# Patient Record
Sex: Male | Born: 1975 | Race: Black or African American | Hispanic: No
Health system: Southern US, Community
[De-identification: ages and names within clinical notes are randomized; demographics above are authoritative.]

## PROBLEM LIST (undated history)

## (undated) HISTORY — PX: OTHER SURGICAL HISTORY: SHX169

---

## 2003-01-03 ENCOUNTER — Encounter: Admission: RE | Admit: 2003-01-03 | Discharge: 2003-01-03 | Payer: Self-pay | Admitting: Occupational Medicine

## 2005-01-14 ENCOUNTER — Emergency Department (HOSPITAL_COMMUNITY): Admission: EM | Admit: 2005-01-14 | Discharge: 2005-01-14 | Payer: Self-pay | Admitting: Family Medicine

## 2011-04-08 ENCOUNTER — Encounter: Payer: Self-pay | Admitting: Gastroenterology

## 2011-04-08 ENCOUNTER — Ambulatory Visit: Payer: Self-pay | Admitting: Internal Medicine

## 2011-04-08 ENCOUNTER — Ambulatory Visit (INDEPENDENT_AMBULATORY_CARE_PROVIDER_SITE_OTHER): Payer: PRIVATE HEALTH INSURANCE | Admitting: Gastroenterology

## 2011-04-08 VITALS — BP 130/70 | HR 72 | Ht 72.0 in | Wt 230.0 lb

## 2011-04-08 DIAGNOSIS — Z809 Family history of malignant neoplasm, unspecified: Secondary | ICD-10-CM

## 2011-04-08 DIAGNOSIS — R1310 Dysphagia, unspecified: Secondary | ICD-10-CM | POA: Insufficient documentation

## 2011-04-08 DIAGNOSIS — K3189 Other diseases of stomach and duodenum: Secondary | ICD-10-CM

## 2011-04-08 DIAGNOSIS — R1013 Epigastric pain: Secondary | ICD-10-CM | POA: Insufficient documentation

## 2011-04-08 MED ORDER — PEG-KCL-NACL-NASULF-NA ASC-C 100 G PO SOLR: 1.0000 | ORAL | Status: DC

## 2011-04-08 NOTE — Patient Instructions (Signed)
You will be set up for a colonoscopy.  

## 2011-04-08 NOTE — Progress Notes (Signed)
HPI: This is a      very pleasant  36 year old man whom I am meeting for the first time today  2/13 labs: normal CBC, normal cmet  Student at [employer].  Has abd pain, flatulence.  H/o colon cancer in family, mother just passed from it last week.  Diagnosed in her late 65s.  Her brother also had colon cancer.    He has mild intermittent constipation.  Thinks he has IBS.  Tends to have BM every day.  NEver sees blood in his stool.  Overall stable weight.  Review of systems: Pertinent positive and negative review of systems were noted in the above HPI section. Complete review of systems was performed and was otherwise normal.    History reviewed. No pertinent past medical history.  Past Surgical History  Procedure Date  . Neg hx     No current outpatient prescriptions on file.    Allergies as of 04/08/2011  . (No Known Allergies)    Family History  Problem Relation Age of Onset  . Colon cancer Mother 71  . Diabetes Father     History   Social History  . Marital Status: Married    Spouse Name: N/A    Number of Children: 1  . Years of Education: N/A   Occupational History  . student     Social History Main Topics  . Smoking status: Former Smoker    Quit date: 04/07/2001  . Smokeless tobacco: Never Used  . Alcohol Use: Yes     social  . Drug Use: No  . Sexually Active: Not on file   Other Topics Concern  . Not on file   Social History Narrative   Caffeine soda qd       Physical Exam: BP 130/70  Pulse 72  Ht 6' (1.829 m)  Wt 230 lb (104.327 kg)  BMI 31.19 kg/m2 Constitutional: generally well-appearing Psychiatric: alert and oriented x3 Eyes: extraocular movements intact Mouth: oral pharynx moist, no lesions Neck: supple no lymphadenopathy Cardiovascular: heart regular rate and rhythm Lungs: clear to auscultation bilaterally Abdomen: soft,  very mild left-sided abdominal pain, lower quadrant, nondistended, no obvious ascites, no peritoneal signs,  normal bowel sounds Extremities: no lower extremity edema bilaterally Skin: no lesions on visible extremities    Assessment and plan: 36 y.o. male with  minor intermittent abdominal discomforts, family history of colon cancer  We will proceed with full colonoscopy at his soonest convenience. I suspect minor irritable bowel syndrome and hopefully some of his symptoms will lessen if we can prove he does not have significant colonic neoplasm like his mother.

## 2011-04-22 ENCOUNTER — Other Ambulatory Visit: Payer: PRIVATE HEALTH INSURANCE | Admitting: Gastroenterology

## 2014-11-25 ENCOUNTER — Emergency Department (HOSPITAL_COMMUNITY)
Admission: EM | Admit: 2014-11-25 | Discharge: 2014-11-25 | Disposition: A | Discharge status: Home / Self Care | Payer: Non-veteran care | Attending: Emergency Medicine | Admitting: Emergency Medicine

## 2014-11-25 ENCOUNTER — Emergency Department (HOSPITAL_COMMUNITY): Payer: Non-veteran care

## 2014-11-25 ENCOUNTER — Emergency Department (HOSPITAL_COMMUNITY)
Admission: EM | Admit: 2014-11-25 | Discharge: 2014-11-25 | Discharge status: Absent without leave | Payer: PRIVATE HEALTH INSURANCE

## 2014-11-25 ENCOUNTER — Encounter (HOSPITAL_COMMUNITY): Payer: Self-pay | Admitting: Emergency Medicine

## 2014-11-25 DIAGNOSIS — Z87891 Personal history of nicotine dependence: Secondary | ICD-10-CM | POA: Insufficient documentation

## 2014-11-25 DIAGNOSIS — M25551 Pain in right hip: Secondary | ICD-10-CM | POA: Diagnosis present

## 2014-11-25 DIAGNOSIS — R269 Unspecified abnormalities of gait and mobility: Secondary | ICD-10-CM | POA: Insufficient documentation

## 2014-11-25 DIAGNOSIS — M79604 Pain in right leg: Secondary | ICD-10-CM | POA: Insufficient documentation

## 2014-11-25 MED ORDER — HYDROCODONE-ACETAMINOPHEN 5-325 MG PO TABS: 1.0000 | ORAL_TABLET | ORAL | Status: DC | PRN

## 2014-11-25 MED ORDER — IBUPROFEN 800 MG PO TABS: 800.0000 mg | ORAL_TABLET | Freq: Three times a day (TID) | ORAL | Status: DC

## 2014-11-25 MED ORDER — HYDROCODONE-ACETAMINOPHEN 5-325 MG PO TABS
2.0000 | ORAL_TABLET | Freq: Once | ORAL | Status: AC
  Administered 2014-11-25: 2 via ORAL
  Filled 2014-11-25: qty 2

## 2014-11-25 NOTE — ED Notes (Signed)
AVS explained in detail. Given follow up information. Ambulatory with steady gait. No other c/c.

## 2014-11-25 NOTE — ED Provider Notes (Signed)
CSN: [id number]     Arrival date & time 11/25/14  1914 History  By signing my name below, I, Tula Nakayama, attest that this documentation has been prepared under the direction and in the presence of Bernerd Limbo, [place].  Electronically Signed: Tula Nakayama, ED Scribe. 11/25/2014. 7:46 PM.   Chief Complaint  Patient presents with  . Hip Pain  . Leg Pain   The history is provided by the patient. No language interpreter was used.    HPI Comments: Erik Robbins is a 39 y.o. male with no pertinent PMH who presents to the Emergency Department complaining of constant, moderate right hip pain that radiates to his right thigh and right knee. He states his symptoms started 3 days ago. He reports movement exacerbates his pain. He has tried acetaminophen, ibuprofen, and heat for symptom relief, which has not been effective. He denies recent injury. He denies history of DVT, history of malignancy, tobacco use, recent immobility, recent surgery. He denies fever, chills, CP, SOB, abdominal pain, diarrhea, nausea, vomiting, dysuria, frequency, urgency, paresthesia, numbness, neck pain, back pain, testicular pain or swelling, penile pain or swelling.Marland Kitchen  No past medical history on file. Past Surgical History  Procedure Laterality Date  . Neg hx     Family History  Problem Relation Age of Onset  . Colon cancer Mother 37  . Diabetes Father   . Colon cancer Maternal Uncle    Social History  Substance Use Topics  . Smoking status: Former Smoker    Quit date: 04/07/2001  . Smokeless tobacco: Never Used  . Alcohol Use: Yes     Comment: social  Review of Systems  Constitutional: Negative for fever and chills.  Respiratory: Negative for shortness of breath.   Cardiovascular: Negative for chest pain and leg swelling.  Gastrointestinal: Negative for nausea, vomiting, abdominal pain and diarrhea.  Genitourinary: Negative for dysuria, frequency, discharge, penile swelling, scrotal swelling,  penile pain and testicular pain.  Musculoskeletal: Positive for myalgias, arthralgias and gait problem. Negative for back pain, joint swelling, neck pain and neck stiffness.       Reports difficulty ambulating due to pain.  Skin: Negative for color change, pallor, rash and wound.  Neurological: Negative for dizziness, weakness, light-headedness, numbness and headaches.  All other systems reviewed and are negative.  Allergies  Review of patient's allergies indicates no known allergies.  Home Medications   Prior to Admission medications   Medication Sig Start Date End Date Taking? Authorizing Provider  peg 3350 powder (MOVIPREP) 100 G SOLR Take 1 kit (100 g total) by mouth as directed. See written handout 04/08/11   Milus Banister, MD    BP 154/93 mmHg  Pulse 103  Temp(Src) 99.1 F (37.3 C) (Oral)  Resp 17  Ht '6\' 2"'  (1.88 m)  Wt 225 lb (102.059 kg)  BMI 28.88 kg/m2  SpO2 100% Physical Exam  Constitutional: He is oriented to person, place, and time. He appears well-developed and well-nourished. No distress.  HENT:  Head: Normocephalic and atraumatic.  Right Ear: External ear normal.  Left Ear: External ear normal.  Nose: Nose normal.  Mouth/Throat: Oropharynx is clear and moist.  Eyes: Conjunctivae and EOM are normal. Pupils are equal, round, and reactive to light.  Neck: Normal range of motion. Neck supple.  Cardiovascular: Normal rate, regular rhythm, normal heart sounds and intact distal pulses.   Pulmonary/Chest: Effort normal and breath sounds normal. No respiratory distress. He has no wheezes. He has no rales.  Abdominal: Soft.  Bowel sounds are normal. He exhibits no distension and no mass. There is no tenderness. There is no rebound and no guarding.  Musculoskeletal: Normal range of motion. He exhibits tenderness. He exhibits no edema.  Mild TTP of right lateral hip and right anterior thigh. Decreased ROM of right lower extremity at hip due to pain. No edema, erythema, or  heat. Strength and sensation intact. Distal pulses intact.  Neurological: He is alert and oriented to person, place, and time. He has normal strength. No sensory deficit.  Skin: Skin is warm and dry. No rash noted. No erythema. No pallor.  Psychiatric: He has a normal mood and affect. His behavior is normal. Judgment and thought content normal.  Nursing note and vitals reviewed.   ED Course  Procedures   DIAGNOSTIC STUDIES: Oxygen Saturation is 100% on RA, normal by my interpretation.    COORDINATION OF CARE: 7:41 PM Discussed treatment plan with pt which includes an x-ray of his right hip and pain management. Pt agreed to plan.  Imaging Review Dg Hip Unilat With Pelvis 2-3 Views Right  11/25/2014   CLINICAL DATA:  Right hip pain for 2 days  EXAM: DG HIP (WITH OR WITHOUT PELVIS) 2-3V RIGHT  COMPARISON:  None.  FINDINGS: Vague tiny bony densities are seen lateral to the superior lip of the right acetabulum. There is no associated lytic bone lesion or evidence of bony destruction. Otherwise, there is no evidence of fracture or dislocation. 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  IMPRESSION: There are vague soft tissue calcifications adjacent to the superior right acetabular lip. There is no associated bone lesion. These findings may represent sequelae of trauma or a calcified soft tissue mass. Consider MRI if clinically indicated.   Electronically Signed   By: Marybelle Killings M.D.   On: 11/25/2014 20:04     I have personally reviewed and evaluated these images as part of my medical decision-making.  MDM   Final diagnoses:  Right hip pain    39 year old male presents with right hip pain radiating to his right anterior thigh and right knee. He denies recent injury, recent immobility, history of DVT, history of malignancy, tobacco use, recent surgery. He denies fever, chills, numbness, paresthesia.  Patient is afebrile. Vitals signs stable. HR initially 103, likely due to pain, as  patient appears uncomfortable. HR 88 s/p pain medication administration.  Mild TTP of right lateral hip and right anterior thigh. Decreased range of motion of right lower extremity at hip due to pain. Strength and sensation intact. Distal pulses intact. Patient ambulates with steady gait, though he states walking causes him pain.  Imaging of right hip remarkable for soft tissue calcifications adjacent to superior right acetabular lip, no associated bone lesion.   Patient to follow-up with ortho for further evaluation and management and for possible MRI. Will give short course of pain medication and anti-inflammatory. Return precautions discussed at length.   I personally performed the services described in this documentation, which was scribed in my presence. The recorded information has been reviewed and is accurate.  BP 125/79 mmHg  Pulse 88  Temp(Src) 99.1 F (37.3 C) (Oral)  Resp 16  Ht '6\' 2"'  (1.88 m)  Wt 225 lb (102.059 kg)  BMI 28.88 kg/m2  SpO2 100%    Marella Chimes, PA-C 11/26/14 [id number]  Lacretia Leigh, MD 11/27/14 [phone]

## 2014-11-25 NOTE — Discharge Instructions (Signed)
1. Medications: pain medicine, ibuprofen, usual home medications 2. Treatment: rest, drink plenty of fluids, ice/heat 3. Follow Up: please followup with orthopedics for discussion of your diagnoses and further evaluation after today's visit; if you do not have a primary care doctor use the resource guide provided to find one; please return to the ER for high fever, severe pain, new or worsening symptoms   Musculoskeletal Pain Musculoskeletal pain is muscle and boney aches and pains. These pains can occur in any part of the body. Your caregiver may treat you without knowing the cause of the pain. They may treat you if blood or urine tests, X-rays, and other tests were normal.  CAUSES There is often not a definite cause or reason for these pains. These pains may be caused by a type of germ (virus). The discomfort may also come from overuse. Overuse includes working out too hard when your body is not fit. Boney aches also come from weather changes. Bone is sensitive to atmospheric pressure changes. HOME CARE INSTRUCTIONS   Ask when your test results will be ready. Make sure you get your test results.  Only take over-the-counter or prescription medicines for pain, discomfort, or fever as directed by your caregiver. If you were given medications for your condition, do not drive, operate machinery or power tools, or sign legal documents for 24 hours. Do not drink alcohol. Do not take sleeping pills or other medications that may interfere with treatment.  Continue all activities unless the activities cause more pain. When the pain lessens, slowly resume normal activities. Gradually increase the intensity and duration of the activities or exercise.  During periods of severe pain, bed rest may be helpful. Lay or sit in any position that is comfortable.  Putting ice on the injured area.  Put ice in a bag.  Place a towel between your skin and the bag.  Leave the ice on for 15 to 20 minutes, 3 to 4  times a day.  Follow up with your caregiver for continued problems and no reason can be found for the pain. If the pain becomes worse or does not go away, it may be necessary to repeat tests or do additional testing. Your caregiver may need to look further for a possible cause. SEEK IMMEDIATE MEDICAL CARE IF:  You have pain that is getting worse and is not relieved by medications.  You develop chest pain that is associated with shortness or breath, sweating, feeling sick to your stomach (nauseous), or throw up (vomit).  Your pain becomes localized to the abdomen.  You develop any new symptoms that seem different or that concern you. MAKE SURE YOU:   Understand these instructions.  Will watch your condition.  Will get help right away if you are not doing well or get worse.   This information is not intended to replace advice given to you by your health care provider. Make sure you discuss any questions you have with your health care provider.   Document Released: 02/07/2005 Document Revised: 05/02/2011 Document Reviewed: 10/12/2012 Elsevier Interactive Patient Education 2016 [employer].   Emergency Department Resource Guide 1) Find a Doctor and Pay Out of Pocket Although you won't have to find out who is covered by your insurance plan, it is a good idea to ask around and get recommendations. You will then need to call the office and see if the doctor you have chosen will accept you as a new patient and what types of options they offer for  patients who are self-pay. Some doctors offer discounts or will set up payment plans for their patients who do not have insurance, but you will need to ask so you aren't surprised when you get to your appointment.  2) Contact Your Local Health Department Not all health departments have doctors that can see patients for sick visits, but many do, so it is worth a call to see if yours does. If you don't know where your local health department is, you  can check in your phone book. The CDC also has a tool to help you locate your state's health department, and many state websites also have listings of all of their local health departments.  3) Find a Walk-in Clinic If your illness is not likely to be very severe or complicated, you may want to try a walk in clinic. These are popping up all over the country in pharmacies, drugstores, and shopping centers. They're usually staffed by nurse practitioners or physician assistants that have been trained to treat common illnesses and complaints. They're usually fairly quick and inexpensive. However, if you have serious medical issues or chronic medical problems, these are probably not your best option.  No Primary Care Doctor: - Call Health Connect at  [phone] - they can help you locate a primary care doctor that  accepts your insurance, provides certain services, etc. - Physician Referral Service- [phone]  Chronic Pain Problems: Organization         Address  Phone   Notes  [facility]  [phone] Patients need to be referred by their primary care doctor.   Medication Assistance: Organization         Address  Phone   Notes  [facility]ication [facility] [address] [place], [place] [address] [phone] --Must be a resident of [facility] [place] -- Must have NO insurance coverage whatsoever (no Medicaid/ Medicare, etc.) -- The pt. MUST have a primary care doctor that directs their care regularly and follows them in the community   MedAssist  [phone]   [employer]  [phone]    Agencies that provide inexpensive medical care: Organization         Address  Phone   Notes  [facility]  [phone]   [facility]    [phone]   [facility] [address] [place], [place] [address] [phone]   [facility] [address], [place] [phone]  [phone]   [facility]anned Parenthood    [phone]   [facility]    [phone]   [facility]  [address], [place] Phone:  [phone], Fax:  [phone] Hours of Operation:  9 am - 6 pm, M-F.  Also accepts Medicaid/Medicare and self-pay.  [facility]  [address], [place] Phone: [phone], Fax: [phone]. Hours of Operation:  8:30 am - 5:30 pm, M-F.  Also accepts Medicaid and self-pay.  [facility] [place] [address], [place] Phone: [phone]   [facility] [address], [place], [place] [phone], Ext. [phone] Mondays & Thursdays: 7-9 AM.  First 15 patients are seen on a first come, first serve basis.    Medicaid-accepting [facility] Providers:  Organization         Address  Phone   Notes  [facility] [address], [place] [phone] Also accepts self-pay patients.  [facility] [address] Laurell Josephs [place], [place]  [phone]   [facility] [address], [place] [phone]   [facility] [address] [place] [phone]   Renaye Rakers [address], [place]   [phone] Only accepts [place] Access [place] patients after they have their name applied to their card.   Self-Pay (no insurance) in [facility]:  Organization         Address  Phone   Notes  Sickle Cell Patients, [facility] [address] [place], [place] [phone]   [facility] [address] [place], [place] [phone]   [facility] [place]  [address], [place] [phone]   [facility]/Dr. Osei-Bonsu  [address] [place] or [address], [place] [phone] Phone number for both [place] and [place] locations  is the same.  [facility]gent [facility] and [facility] [address] [place] [phone]   [facility] [address] [place] or [address] [phone]   [facility] [address], [place] [phone], phone; [phone], fax Sees patients 1st and 3rd Saturday of every month.  Must not qualify for public or private insurance (i.e. Medicaid, Medicare, [place] Health Choice, Veterans' Benefits)  Household income should be no more than 200% of the poverty level The clinic cannot treat you if you are pregnant or think you are pregnant  Sexually transmitted diseases are not treated at the clinic.    Dental Care: Organization         Address  Phone  Notes  [facility] Department of [facility] [address] [place], [place] [phone] Accepts children up to age 27 who are enrolled in [place] or [place] Health Choice; pregnant women with a Medicaid card; and children who have applied for Medicaid or [place] Health Choice, but were declined, whose parents can pay a reduced fee at time of service.  [facility] of [facility]  [address], [place] [phone] Accepts children up to age 96 who are enrolled in [place] or [place] Health Choice; pregnant women with a Medicaid card; and children who have applied for Medicaid or [place] Health Choice, but were declined, whose parents can pay a reduced fee at time of service.  [facility]  [address] [place], [place] [phone] Patients are seen by appointment only. Walk-ins are not accepted. [facility] will see patients 34 years of age and older. Monday - Tuesday (8am-5pm) Most Wednesdays (8:30-5pm) $30 per visit, cash only  [facility] Adult Dental Access PROGRAM  [address], [facility] [phone] Patients are seen by appointment only. Walk-ins are not accepted. [facility] will see  patients 36 years of age and older. One Wednesday Evening (Monthly: Volunteer Based).  $30 per visit, cash only  [employer]  [phone] for adults; Children under age 73, call Graduate Pediatric Dentistry at [phone]. Children aged 44-14, please call [phone] to request a pediatric application.  Dental services are provided in all areas of dental care including fillings, crowns and bridges, complete and partial dentures, implants, gum treatment,  root canals, and extractions. Preventive care is also provided. Treatment is provided to both adults and children. Patients are selected via a lottery and there is often a waiting list.   [facility] [address], [place]  [phone] [email or web address]   [facility] [address] [place], [place] [phone], Ext. [phone] Second and Fourth Thursday of each month, opens at 6:30 AM; Clinic ends at 9 AM.  Patients are seen on a first-come first-served basis, and a limited number are seen during each clinic.   [facility]  [address] Hillard Danker [place], [place] [phone]   Eligibility Requirements You must have lived in [place], [place], or [place] counties for at least the last three months.   You cannot be eligible for state or federal sponsored [employer], including [employer], [place], or [employer].   You generally cannot be eligible for healthcare insurance through your employer.    How to apply: Eligibility screenings are held every Tuesday and Wednesday afternoon from 1:00 pm until 4:00 pm. You do not need an appointment for the interview!  [facility] [address] [place], [place]   [place]  [place] Department  [place]  [phone]    Behavioral Health Resources in the Community: Intensive Outpatient  Programs Organization         Address  Phone  Notes  [facility] [place]. [address] [place], [place] [phone]   [facility] [address] [place], [place]   [facility] [address] [place], [place]   [place] [address],  [place], [place] or [phone]   Substance Abuse Resources Organization         Address  Phone  Notes  [facility] Drug [facility]  [phone]   [place]  [phone]   [facility] [place]   Chinita Pester  [phone]   Residential & Outpatient Substance Abuse Program  [phone]   Psychological Services Organization         Address  Phone  Notes  [facility]  [place]  [phone]   [place] [address], [place] or [phone]    Mobile Crisis Teams Organization         Address  Phone  Notes  [facility], Mobile Crisis Care Unit  [phone]   [facility]  [address] [place], [place]   [place] Levels [address] [place] [phone]    Self-Help/Support Groups Organization         Address  Phone             Notes  [place]. of [place] - variety of support groups  [place] Call for more information  Narcotics Anonymous (NA), [facility] [address], [employer] [place]  2 meetings at this location   Special educational needs teacher         Address  Phone  Notes  [facility] [place],    [place]  [place]  [address] [place], [place], [place]   [place], [place] [phone] Admissions: 8am-3pm M-F  Incentives Substance [place]  [address],    [place], [place]  [phone]   [facility] [address] [place], [place], [place] [phone]   [facility] [address]  [place], [place] [phone]   [facility] - Intensive Outpatient [address] Laurell Joseph[address], [place], [place] [phone]   [facility] [address] [place] [address]  [place], [place] [phone] or [phone]   [facility] (RTS) [address] [place], [place] [phone] Accepts Medicaid  [facility] [place] [address]  [place] [phone] Substance Abuse/Addiction Treatment   [facility] Organization         Address  Phone  Notes  [facility]  [phone]   Angie Fava, PhD [address] Ervin Knack [place], [place]   [phone] or [phone]   [facility]al   [address] [place], [place] [phone]   [facility] [address] [place], [place] [phone] Insurance/Medicaid/sponsorship through [place] and Families [address]                                    [place], [place] [phone] Therapy/tele-psych/case  [facility] [address], [place] [phone]    Dr. Lolly Mustache  [phone]   [facility]  [employer] [facility] Dept. 1) [address] [place] 2) [address], [place] 3)  [address], [place] [phone]  [phone]   [facility] Child Abuse Hotline (986)381-2556 or [phone] (After Hours)

## 2015-02-19 ENCOUNTER — Emergency Department (HOSPITAL_COMMUNITY): Payer: No Typology Code available for payment source

## 2015-02-19 ENCOUNTER — Encounter (HOSPITAL_COMMUNITY): Payer: Self-pay | Admitting: Emergency Medicine

## 2015-02-19 ENCOUNTER — Emergency Department (HOSPITAL_COMMUNITY)
Admission: EM | Admit: 2015-02-19 | Discharge: 2015-02-19 | Disposition: A | Discharge status: Home / Self Care | Payer: No Typology Code available for payment source | Attending: Emergency Medicine | Admitting: Emergency Medicine

## 2015-02-19 DIAGNOSIS — S3992XA Unspecified injury of lower back, initial encounter: Secondary | ICD-10-CM | POA: Diagnosis not present

## 2015-02-19 DIAGNOSIS — Z87891 Personal history of nicotine dependence: Secondary | ICD-10-CM | POA: Insufficient documentation

## 2015-02-19 DIAGNOSIS — S199XXA Unspecified injury of neck, initial encounter: Secondary | ICD-10-CM | POA: Insufficient documentation

## 2015-02-19 DIAGNOSIS — Y9389 Activity, other specified: Secondary | ICD-10-CM | POA: Diagnosis not present

## 2015-02-19 DIAGNOSIS — Y92410 Unspecified street and highway as the place of occurrence of the external cause: Secondary | ICD-10-CM | POA: Insufficient documentation

## 2015-02-19 DIAGNOSIS — S060X0A Concussion without loss of consciousness, initial encounter: Secondary | ICD-10-CM | POA: Insufficient documentation

## 2015-02-19 DIAGNOSIS — Y998 Other external cause status: Secondary | ICD-10-CM | POA: Diagnosis not present

## 2015-02-19 DIAGNOSIS — S0990XA Unspecified injury of head, initial encounter: Secondary | ICD-10-CM | POA: Diagnosis present

## 2015-02-19 MED ORDER — OXYCODONE-ACETAMINOPHEN 5-325 MG PO TABS: 1.0000 | ORAL_TABLET | ORAL | Status: DC | PRN

## 2015-02-19 MED ORDER — METHOCARBAMOL 500 MG PO TABS: 500.0000 mg | ORAL_TABLET | Freq: Two times a day (BID) | ORAL | Status: DC

## 2015-02-19 MED ORDER — IBUPROFEN 800 MG PO TABS: 800.0000 mg | ORAL_TABLET | Freq: Three times a day (TID) | ORAL | Status: DC

## 2015-02-19 MED ORDER — IBUPROFEN 800 MG PO TABS
800.0000 mg | ORAL_TABLET | Freq: Once | ORAL | Status: AC
  Administered 2015-02-19: 800 mg via ORAL
  Filled 2015-02-19: qty 1
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
## 2015-02-19 NOTE — ED Notes (Signed)
Patient complaining of dizziness ("my head is spinning") and "I can't walk right, it's like I'm disoriented".   Patient was brought to fast track by wheelchair from PEDS.  Called and advised charge, RN that patient needed to be moved after speaking with PA.   Charge advised would come and handle patient placement.

## 2018-07-21 ENCOUNTER — Emergency Department
Admission: EM | Admit: 2018-07-21 | Discharge: 2018-07-21 | Disposition: A | Discharge status: Home / Self Care | Payer: PRIVATE HEALTH INSURANCE | Attending: Emergency Medicine | Admitting: Emergency Medicine

## 2018-07-21 ENCOUNTER — Emergency Department: Payer: PRIVATE HEALTH INSURANCE

## 2018-07-21 ENCOUNTER — Other Ambulatory Visit: Payer: Self-pay

## 2018-07-21 DIAGNOSIS — Y92410 Unspecified street and highway as the place of occurrence of the external cause: Secondary | ICD-10-CM | POA: Insufficient documentation

## 2018-07-21 DIAGNOSIS — S161XXA Strain of muscle, fascia and tendon at neck level, initial encounter: Secondary | ICD-10-CM | POA: Insufficient documentation

## 2018-07-21 DIAGNOSIS — Z87891 Personal history of nicotine dependence: Secondary | ICD-10-CM | POA: Insufficient documentation

## 2018-07-21 DIAGNOSIS — Y9389 Activity, other specified: Secondary | ICD-10-CM | POA: Diagnosis not present

## 2018-07-21 DIAGNOSIS — S39012A Strain of muscle, fascia and tendon of lower back, initial encounter: Secondary | ICD-10-CM | POA: Insufficient documentation

## 2018-07-21 DIAGNOSIS — S0990XA Unspecified injury of head, initial encounter: Secondary | ICD-10-CM | POA: Diagnosis present

## 2018-07-21 DIAGNOSIS — Y999 Unspecified external cause status: Secondary | ICD-10-CM | POA: Diagnosis not present

## 2018-07-21 DIAGNOSIS — S060X0A Concussion without loss of consciousness, initial encounter: Secondary | ICD-10-CM | POA: Diagnosis not present

## 2018-07-21 MED ORDER — IBUPROFEN 600 MG PO TABS: 600.0000 mg | ORAL_TABLET | Freq: Four times a day (QID) | ORAL | 0 refills | Status: AC | PRN

## 2018-07-21 MED ORDER — ORPHENADRINE CITRATE 30 MG/ML IJ SOLN
60.0000 mg | Freq: Once | INTRAMUSCULAR | Status: AC
  Administered 2018-07-21: 18:00:00 60 mg via INTRAMUSCULAR
  Filled 2018-07-21: qty 2

## 2018-07-21 MED ORDER — CYCLOBENZAPRINE HCL 5 MG PO TABS: 5.0000 mg | ORAL_TABLET | Freq: Three times a day (TID) | ORAL | 0 refills | Status: AC | PRN

## 2018-07-21 MED ORDER — OXYCODONE-ACETAMINOPHEN 5-325 MG PO TABS
2.0000 | ORAL_TABLET | Freq: Once | ORAL | Status: AC
  Administered 2018-07-21: 18:00:00 2 via ORAL
  Filled 2018-07-21: qty 2

## 2018-07-21 MED ORDER — HYDROCODONE-ACETAMINOPHEN 5-325 MG PO TABS: 1.0000 | ORAL_TABLET | Freq: Four times a day (QID) | ORAL | 0 refills | Status: AC | PRN

## 2018-07-21 NOTE — ED Notes (Signed)
Provider at bedside
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
## 2018-07-21 NOTE — Discharge Instructions (Signed)
Please take medications as prescribed.  Return to the ER for any severe headaches vomiting worsening symptoms or urgent changes in your health.  Follow-up primary care doctor in 1 week if no improvement.

## 2018-07-21 NOTE — ED Notes (Signed)
Wife and sons in room with pt
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
## 2018-07-21 NOTE — ED Notes (Signed)
Pt went for scans
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
## 2021-11-09 ENCOUNTER — Emergency Department (HOSPITAL_COMMUNITY)
Admission: EM | Admit: 2021-11-09 | Discharge: 2021-11-21 | Disposition: E | Payer: No Typology Code available for payment source | Attending: Emergency Medicine | Admitting: Emergency Medicine

## 2021-11-09 ENCOUNTER — Encounter (HOSPITAL_COMMUNITY): Payer: Self-pay

## 2021-11-09 DIAGNOSIS — I469 Cardiac arrest, cause unspecified: Secondary | ICD-10-CM | POA: Diagnosis not present

## 2021-11-09 DIAGNOSIS — W3400XA Accidental discharge from unspecified firearms or gun, initial encounter: Secondary | ICD-10-CM | POA: Insufficient documentation

## 2021-11-09 DIAGNOSIS — S0193XA Puncture wound without foreign body of unspecified part of head, initial encounter: Secondary | ICD-10-CM | POA: Insufficient documentation

## 2021-11-09 DIAGNOSIS — S0990XA Unspecified injury of head, initial encounter: Secondary | ICD-10-CM | POA: Diagnosis present
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
## 2021-11-21 NOTE — ED Notes (Signed)
Pulse check, PEA, time of death 944 pm
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
## 2021-11-21 DEATH — deceased
